# Patient Record
Sex: Male | Born: 1970 | Race: Black or African American | Hispanic: No | Marital: Single | Smoking: Current every day smoker
Health system: Southern US, Community
[De-identification: ages and names within clinical notes are randomized; demographics above are authoritative.]

## PROBLEM LIST (undated history)

## (undated) HISTORY — PX: HAND SURGERY: SHX662

---

## 2001-03-26 ENCOUNTER — Emergency Department (HOSPITAL_COMMUNITY): Admission: EM | Admit: 2001-03-26 | Discharge: 2001-03-26 | Payer: Self-pay | Admitting: Emergency Medicine

## 2001-03-26 ENCOUNTER — Encounter: Payer: Self-pay | Admitting: Emergency Medicine

## 2002-04-18 ENCOUNTER — Encounter: Payer: Self-pay | Admitting: Emergency Medicine

## 2002-04-18 ENCOUNTER — Emergency Department (HOSPITAL_COMMUNITY): Admission: EM | Admit: 2002-04-18 | Discharge: 2002-04-18 | Payer: Self-pay | Admitting: Emergency Medicine

## 2012-08-06 ENCOUNTER — Emergency Department (HOSPITAL_COMMUNITY): Payer: BC Managed Care – PPO

## 2012-08-06 ENCOUNTER — Encounter (HOSPITAL_COMMUNITY): Payer: Self-pay | Admitting: Emergency Medicine

## 2012-08-06 ENCOUNTER — Emergency Department (HOSPITAL_COMMUNITY)
Admission: EM | Admit: 2012-08-06 | Discharge: 2012-08-06 | Disposition: A | Discharge status: Home / Self Care | Payer: BC Managed Care – PPO | Attending: Emergency Medicine | Admitting: Emergency Medicine

## 2012-08-06 DIAGNOSIS — R0602 Shortness of breath: Secondary | ICD-10-CM | POA: Insufficient documentation

## 2012-08-06 DIAGNOSIS — F17200 Nicotine dependence, unspecified, uncomplicated: Secondary | ICD-10-CM | POA: Insufficient documentation

## 2012-08-06 DIAGNOSIS — J90 Pleural effusion, not elsewhere classified: Secondary | ICD-10-CM | POA: Insufficient documentation

## 2012-08-06 LAB — CBC WITH DIFFERENTIAL/PLATELET
Basophils Absolute: 0.1 10*3/uL (ref 0.0–0.1)
Basophils Relative: 1 % (ref 0–1)
Eosinophils Absolute: 0.3 10*3/uL (ref 0.0–0.7)
Lymphs Abs: 4 10*3/uL (ref 0.7–4.0)
MCH: 28.4 pg (ref 26.0–34.0)
Neutrophils Relative %: 57 % (ref 43–77)
Platelets: 211 10*3/uL (ref 150–400)
RBC: 5.35 MIL/uL (ref 4.22–5.81)
RDW: 13 % (ref 11.5–15.5)

## 2012-08-06 LAB — POCT I-STAT TROPONIN I: Troponin i, poc: 0.01 ng/mL (ref 0.00–0.08)

## 2012-08-06 LAB — BASIC METABOLIC PANEL
GFR calc Af Amer: >90 mL/min (ref >90)
GFR calc non Af Amer: 86 mL/min — ABNORMAL LOW (ref >90)
Glucose, Bld: 99 mg/dL (ref 70–99)
Potassium: 3.9 mEq/L (ref 3.5–5.1)
Sodium: 138 mEq/L (ref 135–145)

## 2012-08-06 LAB — D-DIMER, QUANTITATIVE: D-Dimer, Quant: 0.56 ug/mL-FEU — ABNORMAL HIGH (ref 0.00–0.48)

## 2012-08-06 MED ORDER — PROMETHAZINE HCL 25 MG PO TABS: 25.0000 mg | ORAL_TABLET | Freq: Four times a day (QID) | ORAL | Status: AC | PRN

## 2012-08-06 MED ORDER — IOHEXOL 350 MG/ML SOLN
100.0000 mL | Freq: Once | INTRAVENOUS | Status: AC | PRN
  Administered 2012-08-06: 100 mL via INTRAVENOUS

## 2012-08-06 MED ORDER — ONDANSETRON HCL 4 MG/2ML IJ SOLN
4.0000 mg | Freq: Once | INTRAMUSCULAR | Status: DC
  Filled 2012-08-06: qty 2

## 2012-08-06 MED ORDER — OXYCODONE-ACETAMINOPHEN 5-325 MG PO TABS: 2.0000 | ORAL_TABLET | Freq: Four times a day (QID) | ORAL | Status: AC | PRN

## 2012-08-06 MED ORDER — MORPHINE SULFATE 4 MG/ML IJ SOLN
4.0000 mg | Freq: Once | INTRAMUSCULAR | Status: DC
  Filled 2012-08-06: qty 1

## 2012-08-06 NOTE — ED Notes (Signed)
Pt states pain still present today, worse yesterday, states had this same type of pain several years ago and resolved on own, denies shortness of breath, nausea, diaphoresis, radiating pain

## 2012-08-06 NOTE — ED Notes (Signed)
Pt from home and reports pain under R ribs. Pt states that he has had this since lasst Sunday. Pt reports that it went away, but came back worse last night. Pt denies SOB, dizziness. Pt adds that it is uncomfortable to lay down. 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 Pt is A&O in NAD. Pt denies N/V, SOB and or any other CP. Pt added that he has had this pain years ago with no tx.

## 2012-08-06 NOTE — ED Provider Notes (Signed)
History     CSN: [id number]  Arrival date & time 08/06/12  1217   First MD Initiated Contact with Patient 08/06/12 1311      Chief Complaint  Patient presents with  . Chest Pain    rib    (Consider location/radiation/quality/duration/timing/severity/associated sxs/prior treatment) HPI Comments: Patient is a 42 year old male who presents today with shortness of breath in chest pain since Sunday. Sunday the pain began after he ate dinner and he thought it was gas pains. He went to bed and the pain resolved Monday. The pain came back yesterday. The pain is a dull ache associated with breathing difficulty. He states it is not a sharp pain when he breathes in, it is just very uncomfortable to breathe. He has had pain like this before which resolved spontaneously when he was in his late teens. He sees a primary care physician regularly and states he has no medical problems. 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 No recent surgeries. He denies any leg swelling or prior history of DVT or pulmonary embolism.  The history is provided by the patient. No language interpreter was used.    History reviewed. No pertinent past medical history.  Past Surgical History  Procedure Laterality Date  . Hand surgery Right     No family history on file.  History  Substance Use Topics  . Smoking status: Current Every Day Smoker -- 0.50 packs/day    Types: Cigarettes  . Smokeless tobacco: Not on file  . Alcohol Use: Yes     Comment: socially      Review of Systems  Constitutional: Negative for fever and chills.  Respiratory: Positive for chest tightness and shortness of breath. Negative for cough.   Cardiovascular: Positive for chest pain. Negative for leg swelling.  Gastrointestinal: Negative for nausea, vomiting and abdominal pain.  All other systems reviewed and are negative.    Allergies  Review of patient's allergies indicates not on file.  Home Medications  No current  outpatient prescriptions on file.  BP 133/83  Pulse 103  Temp(Src) 98.7 F (37.1 C)  SpO2 98%  Physical Exam  Nursing note and vitals reviewed. Constitutional: He is oriented to person, place, and time. He appears well-developed and well-nourished. No distress.  HENT:  Head: Normocephalic and atraumatic.  Right Ear: External ear normal.  Left Ear: External ear normal.  Nose: Nose normal.  Eyes: Conjunctivae are normal.  Neck: Normal range of motion. No tracheal deviation present.  Cardiovascular: Normal rate, regular rhythm, normal heart sounds, intact distal pulses and normal pulses.   Pulmonary/Chest: Effort normal and breath sounds normal. No stridor.  Mildly tender to palpation over right chest; not consistently reproducible - sometimes palpation improves pain  Abdominal: Soft. He exhibits no distension. There is no tenderness.  Musculoskeletal: Normal range of motion.  Neurological: He is alert and oriented to person, place, and time.  Skin: Skin is warm and dry. He is not diaphoretic.  Psychiatric: He has a normal mood and affect. His behavior is normal.    ED Course  Procedures (including critical care time)  Labs Reviewed  CBC WITH DIFFERENTIAL - Abnormal; Notable for the following:    WBC 11.9 (*)    All other components within normal limits  BASIC METABOLIC PANEL - Abnormal; Notable for the following:    GFR calc non Af Amer 86 (*)    All other components within normal limits  D-DIMER, QUANTITATIVE - Abnormal; Notable for the  following:    D-Dimer, Quant 0.56 (*)    All other components within normal limits  POCT I-STAT TROPONIN I   Dg Chest 2 View  08/06/2012   *RADIOLOGY REPORT*  Clinical Data: Chest pain  CHEST - 2 VIEW  Comparison: None.  Findings: Heart size is upper normal.  Negative for heart failure. The lungs are free of infiltrate effusion or mass.  No skeletal abnormality.  IMPRESSION: Negative   Original Report Authenticated By: Janeece Riggers, M.D.    Ct Angio Chest Pe W/cm &/or Wo Cm  08/06/2012   *RADIOLOGY REPORT*  Clinical Data: Chest pain.  Elevated D-dimer.  CT ANGIOGRAPHY CHEST  Technique:  Multidetector CT imaging of the chest using the standard protocol during bolus administration of intravenous contrast. Multiplanar reconstructed images including MIPs were obtained and reviewed to evaluate the vascular anatomy.  Contrast: OMNIPAQUE IOHEXOL 350 MG/ML SOLN  Comparison: Chest x-ray dated 08/06/2012  Findings: There are no pulmonary emboli.  There is a small right pleural effusion.  No infiltrates.  There are scattered lymph nodes in the mediastinum, none of which are pathologically enlarged. Overall heart size and pulmonary vascularity are normal.  The osseous structures are normal.  Visualized portion of the upper abdomen is normal.  IMPRESSION: Small nonspecific right pleural effusion.  Otherwise, normal exam. No pulmonary emboli.   Original Report Authenticated By: Francene Boyers, M.D.     1. Pleural effusion       MDM  Patient presents with right sided chest pain associated with shortness of breath. Troponin negative. No concern for ACS. CT angio was done to rule out PE and pleural effusion was found. It is small and likely self limiting. Discussed this with patient. Pulmonology referral if symptoms do not improve. Return instructions given. Case discussed with Dr. Clarene Duke who agrees with plan. Vital signs stable for discharge. Patient / Family / Caregiver informed of clinical course, understand medical decision-making process, and agree with plan.         Mora Bellman, PA-C 08/07/12 1238

## 2012-08-06 NOTE — ED Notes (Signed)
Bed:WA03<BR> Expected date:<BR> Expected time:<BR> Means of arrival:<BR> Comments:<BR>

## 2012-08-06 NOTE — ED Notes (Signed)
Pt ambulates in hallway with O2 sats 100% and HR 91. Pt denies shortness of breath or increased chest pain.

## 2012-08-06 NOTE — Progress Notes (Signed)
   CARE MANAGEMENT ED NOTE 08/06/2012  Patient:  AURTHUR, WINGERTER   Account Number:  [id number]  Date Initiated:  08/06/2012  Documentation initiated by:  Radford Pax  Subjective/Objective Assessment:   patient presents to ED with pain under his ribs.     Subjective/Objective Assessment Detail:     Action/Plan:   Action/Plan Detail:   Anticipated DC Date:       Status Recommendation to Physician:   Result of Recommendation:    Other ED Services  Consult Working Plan    DC Planning Services  Other  PCP issues    Choice offered to / List presented to:            Status of service:  Completed, signed off  ED Comments:   ED Comments Detail:  Patient listed as not having a pcp.  EDCM spke to patient who stated Dr. Ralene Ok in [place] is his pcp. Offfered support to patient.  No furthe rneeds at this time.  System updated.

## 2012-08-06 NOTE — ED Notes (Signed)
offered patient morphine pain med, pt states he doesn't want at this time, family in room

## 2012-08-09 NOTE — ED Provider Notes (Signed)
Medical screening examination/treatment/procedure(s) were performed by non-physician practitioner and as supervising physician I was immediately available for consultation/collaboration.   Laray Anger, DO 08/09/12 1414

## 2014-12-31 IMAGING — CT CT ANGIO CHEST
1 of 2 series · 20 of 32 positions shown · IV contrast (OMNIPAQUE 300)
Comparison: Chest x-ray dated 08/06/2012

CLINICAL DATA: Chest pain.  Elevated D-dimer.

CT ANGIOGRAPHY CHEST
TECHNIQUE: Multidetector CT imaging of the chest using the
standard protocol during bolus administration of intravenous
contrast. Multiplanar reconstructed images including MIPs were
obtained and reviewed to evaluate the vascular anatomy.
Contrast: 100mL OMNIPAQUE IOHEXOL 350 MG/ML SOLN

[Series 8: thins for pacs · axial · 0.74mm/px · z∈[-200,+43]mm · 20 of 271 slices shown]
[im 14/271  lung]
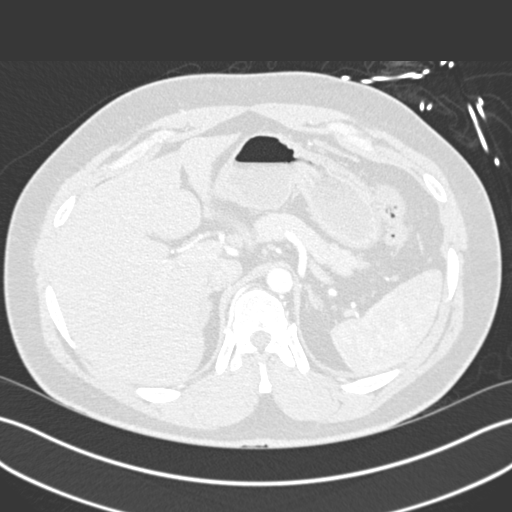
[im 28/271  mediastinal]
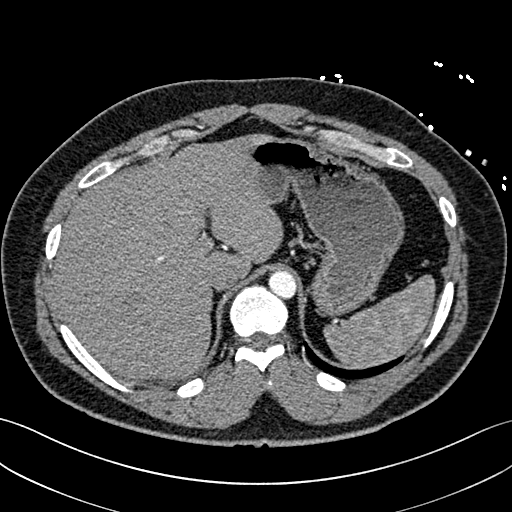
[im 41/271  lung]
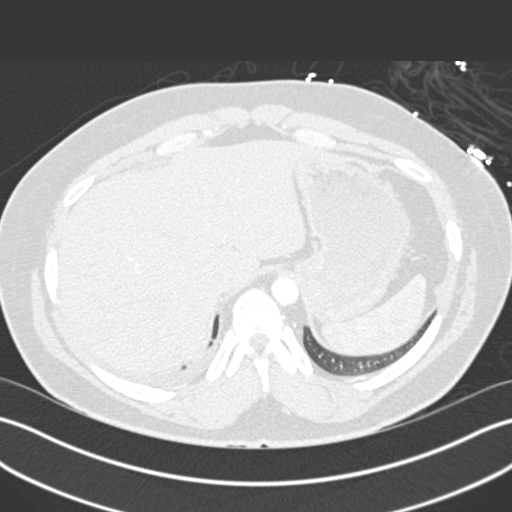
[im 55/271  mediastinal]
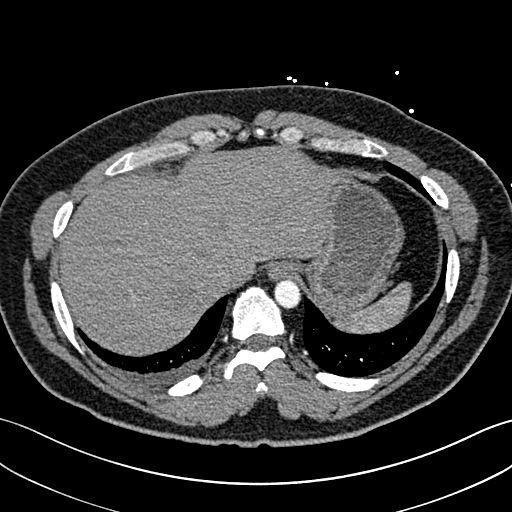
[im 68/271  lung]
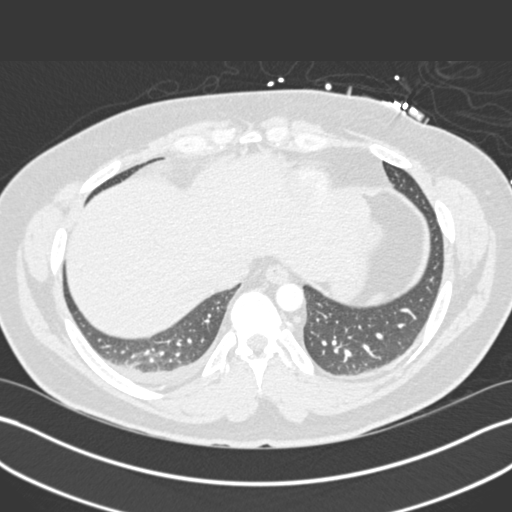
[im 91/271  mediastinal]
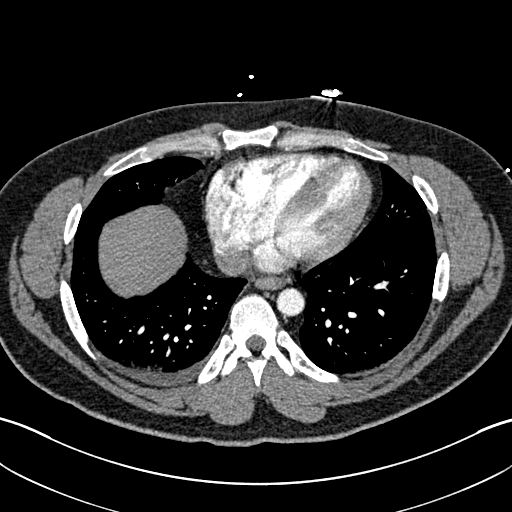
[im 95/271  lung]
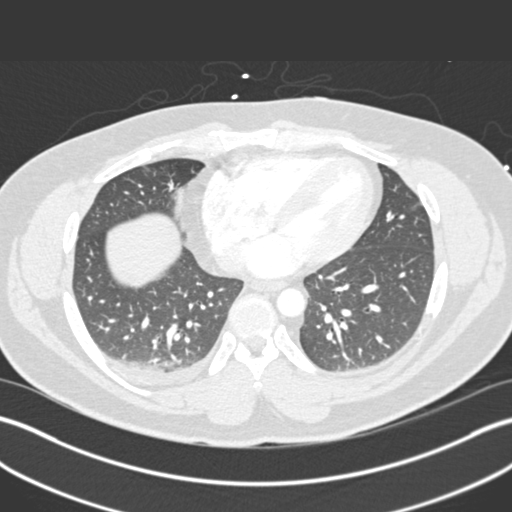
[im 109/271  mediastinal]
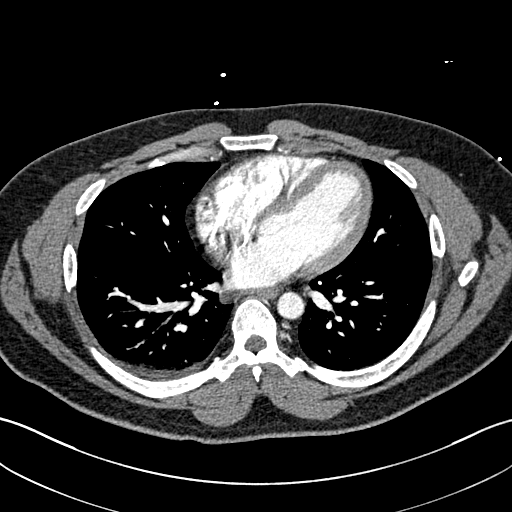
[im 122/271  lung]
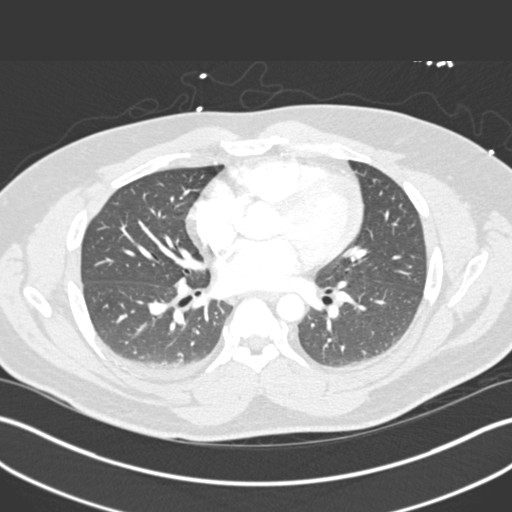
[im 126/271  mediastinal]
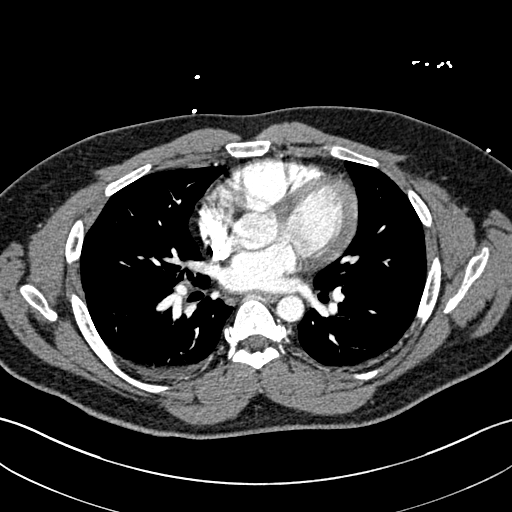
[im 136/271  lung]
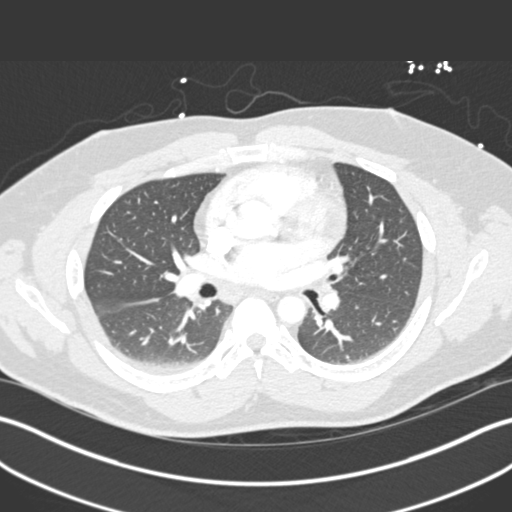
[im 149/271  mediastinal]
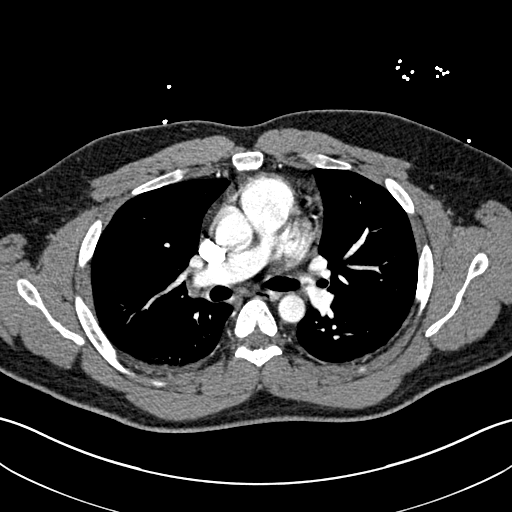
[im 163/271  lung]
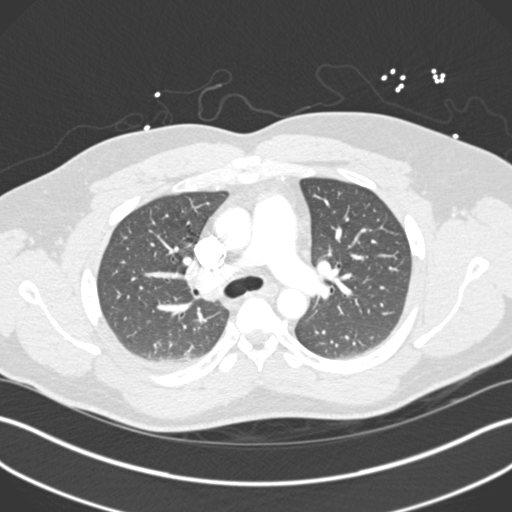
[im 176/271  mediastinal]
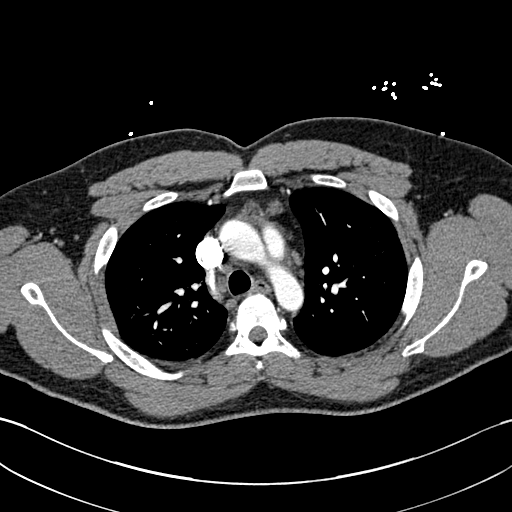
[im 181/271  lung]
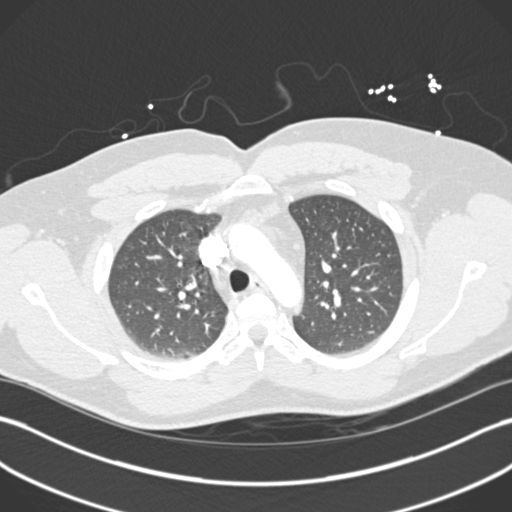
[im 203/271  mediastinal]
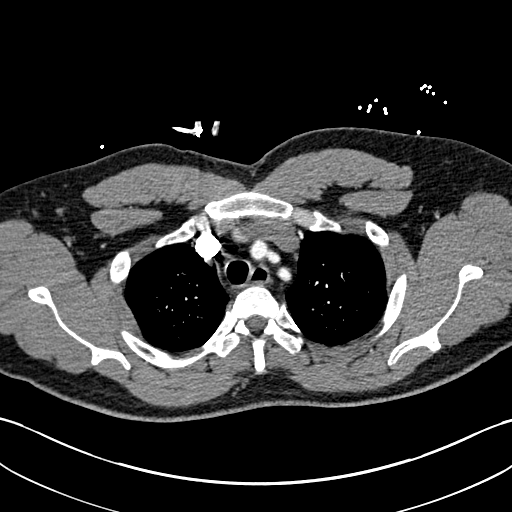
[im 217/271  lung]
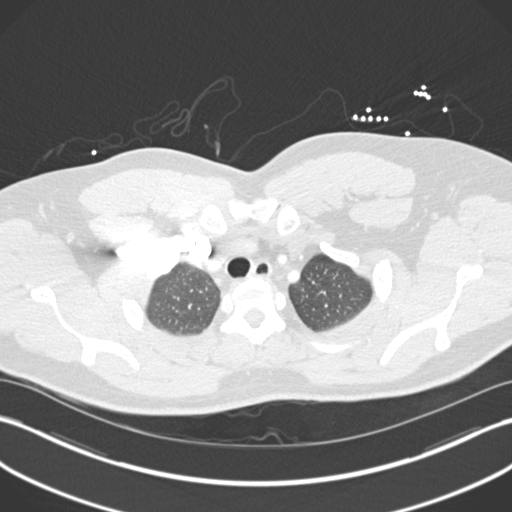
[im 230/271  mediastinal]
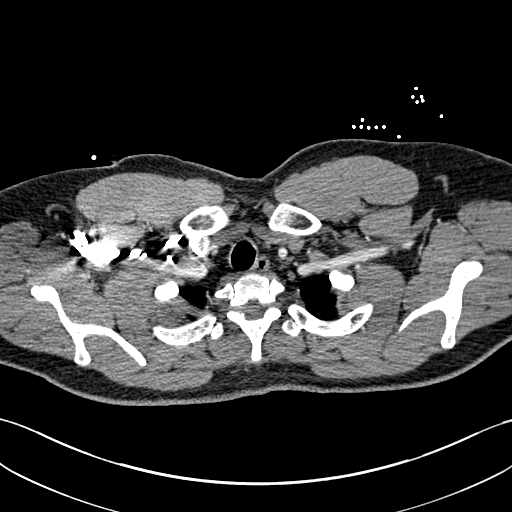
[im 244/271  lung]
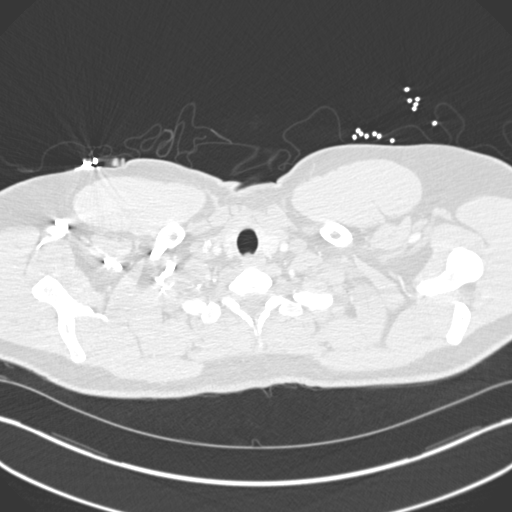
[im 257/271  mediastinal]
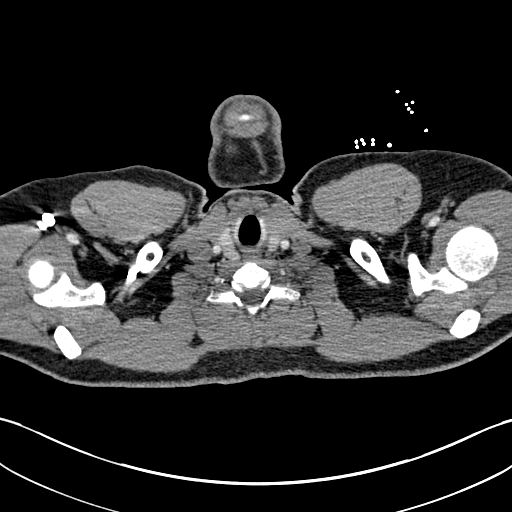

[20 of 32 positions shown; findings below may reference images not displayed]

FINDINGS: There are no pulmonary emboli.  There is a small right
pleural effusion.  No infiltrates.  There are scattered lymph nodes
in the mediastinum, none of which are pathologically enlarged.
Overall heart size and pulmonary vascularity are normal.  The
osseous structures are normal.  Visualized portion of the upper
abdomen is normal.
IMPRESSION: Small nonspecific right pleural effusion.  Otherwise, normal exam.
No pulmonary emboli.

## 2017-05-14 ENCOUNTER — Other Ambulatory Visit: Payer: Self-pay

## 2017-05-14 ENCOUNTER — Encounter: Payer: Self-pay | Admitting: Physical Therapy

## 2017-05-14 ENCOUNTER — Ambulatory Visit: Payer: Commercial Managed Care - PPO | Attending: Internal Medicine | Admitting: Physical Therapy

## 2017-05-14 DIAGNOSIS — R293 Abnormal posture: Secondary | ICD-10-CM | POA: Insufficient documentation

## 2017-05-14 DIAGNOSIS — M542 Cervicalgia: Secondary | ICD-10-CM | POA: Insufficient documentation

## 2017-05-14 DIAGNOSIS — M6281 Muscle weakness (generalized): Secondary | ICD-10-CM | POA: Insufficient documentation

## 2017-05-14 NOTE — Patient Instructions (Signed)
   Extensors, Supine   Lie supine, head on small, rolled towel. Gently tuck chin and bring toward chest. Hold _5__ seconds. Repeat __10_ times per session. Do _4-5__ sessions per day.  Copyright  VHI. All rights reserved.  Flexibility: Neck Retraction   Pull head straight back, keeping eyes and jaw level. Repeat _10___ times per set. Do __1__ sets per session. Do __4-5__ sessions per day.  http://orth.exer.us/344   Copyright  VHI. All rights reserved.   http://gt2.exer.us/30   Copyright  VHI. All rights reserved.  Side-Bending   One hand on opposite side of head, pull head to side as far as is comfortable. Stop if there is pain. Hold _20___ seconds. Repeat with other hand to other side. Repeat __3__ times. Do ___4-5_ sessions per day.   Copyright  VHI. All rights reserved.  Scapular Retraction (Standing)   With arms at sides, pinch shoulder blades together. Repeat __10__ times per set. Do _1___ sets per session. Do __4-5__ sessions per day.  http://orth.exer.us/944   Copyright  VHI. All rights reserved.      Keith SharpsStacy Chandler Orozco PT [facility] [address], [address] [place], [place] [address] Phone # [phone] Fax [phone]

## 2017-05-14 NOTE — Therapy (Signed)
[facility] [address], [address] [place], [place], [address] Phone: [phone]   Fax:  [phone]  Physical Therapy Evaluation  Patient Details  Name: Keith Orozco MRN: [id number] Date of Birth: 1970-06-23 Referring Provider: Dr. Ludwig ClarksMoreira   Encounter Date: 05/14/2017  PT End of Session - 05/14/17 1711    Visit Number  1    Date for PT Re-Evaluation  07/09/17    Authorization Type  [facility]    PT Start Time  1530    PT Stop Time  1612    PT Time Calculation (min)  42 min    Activity Tolerance  Patient tolerated treatment well       History reviewed. No pertinent past medical history.  Past Surgical History:  Procedure Laterality Date  . HAND SURGERY Right     There were no vitals filed for this visit.   Subjective Assessment - 05/14/17 1534    Subjective  1 month history of right neck pain he attributes to a lot of driving.  Drives a city bus aggravates.  Driving my car is OK.      Pertinent History  been driving bus for 27 years    Diagnostic tests  none    Patient Stated Goals  get this pain squared away    Currently in Pain?  Yes    Pain Score  4     Pain Location  Neck    Pain Orientation  Right    Aggravating Factors   turning my head;  reaching while driving for the fare box;  reaching for button to lower bus height    Pain Relieving Factors  Alleve;  resting right hand on thigh         [facility] PT Assessment - 05/14/17 0001      Assessment   Medical Diagnosis  cervicalgia and shoulder pain    Referring Provider  Dr. Ludwig ClarksMoreira    Onset Date/Surgical Date  -- 1 month    Hand Dominance  Right    Next MD Visit  April    Prior Therapy  long time ago when playing ball      Precautions   Precautions  None      Restrictions   Weight Bearing Restrictions  No      Balance Screen   Has the patient fallen in the past 6 months  No    Has the patient had a decrease in activity level because of a fear of falling?    No    Is the patient reluctant to leave their home because of a fear of falling?   No      Home Public house managernvironment   Living Environment  Private residence      Prior Function   Level of Independence  Independent    Vocation  Full time employment    Vocation Requirements  drive city bus    Leisure  watch sports "I'm a home body"      Observation/Other Assessments   Focus on Therapeutic Outcomes (FOTO)   41% limitation       Posture/Postural Control   Postural Limitations  Rounded Shoulders;Forward head;Decreased lumbar lordosis      AROM   Right/Left Shoulder  -- full shoulder ROM and painfree    Cervical Flexion  50    Cervical Extension  70    Cervical - Right Side Bend  28    Cervical - Left Side Bend  30  Cervical - Right Rotation  40    Cervical - Left Rotation  43      Strength   Right Shoulder Flexion  5/5    Right Shoulder Extension  5/5    Right Shoulder ABduction  5/5    Right Shoulder Horizontal ABduction  4/5    Left Shoulder Flexion  5/5    Left Shoulder Extension  5/5    Left Shoulder ABduction  5/5    Left Shoulder Horizontal ABduction  4/5    Cervical Flexion  4/5    Cervical Extension  4/5      Palpation   Palpation comment  tender right upper trap, levator and cervical paraspinals      Distraction Test   Findngs  Negative              No data recorded  Objective measurements completed on examination: See above findings.              PT Education - 05/14/17 1710    Education provided  Yes    Education Details  cervical retractions, upper trap stretch, sitting postural correction    Person(s) Educated  Patient    Methods  Explanation;Handout    Comprehension  Verbalized understanding;Returned demonstration       PT Short Term Goals - 05/14/17 1719      PT SHORT TERM GOAL #1   Title  The patient will demonstrate understanding of  initial self care strategies including postural correction, stretching, heat    Time  4     Period  Weeks    Status  New    Target Date  06/11/17      PT SHORT TERM GOAL #2   Title  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    Time  4    Period  Weeks    Status  New      PT SHORT TERM GOAL #3   Title  Improved cervical sidebending ROM to 35 and rotation to 45 degrees bilaterally    Time  4    Period  Weeks    Status  New        PT Long Term Goals - 05/14/17 1721      PT LONG TERM GOAL #1   Title  The patient will be independent in safe self progression of HEP     Time  8    Period  Weeks    Status  New    Target Date  07/09/17      PT LONG TERM GOAL #2   Title  The patient will report a 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    Time  8    Period  Weeks    Status  New      PT LONG TERM GOAL #3   Title  The patient will have improved bilateral cervical rotation to 50 degrees needed for driving    Time  8    Period  Weeks    Status  New      PT LONG TERM GOAL #4   Title  Scapular/cervical strength 4+/5 needed for maintaining postural support for operating the city bus    Time  8    Period  Weeks    Status  New      PT LONG TERM GOAL #5   Title  FOTO functional outcome score  improved from 41% limitation to 25% indicating improved function with less pain    Time  8    Period  Weeks    Status  New             Plan - 05/14/17 1711    Clinical Impression Statement  The patient reports a 1 month history of pain in right neck/upper trapezius muscle region while driving the city bus.  He reports the pain is especially aggravated with reaching the fare box and the buttons to operate the bus functions.  He has numerous tender points in his cervical paraspinals, upper trap and levator scapulae muscles.  Decreased cervical ROM especially sidebending and rotation.  UE ROM is full and painless.   Good strength overall except weakness in middle and lower traps and deep cervical flexors  and extensors.   Poor sitting posture.  No neural signs.  He would benefit from PT to address these deficits.      History and Personal Factors relevant to plan of care:  no co-morbidities;  lack of regular exercise    Clinical Presentation  Stable    Clinical Decision Making  Low    Rehab Potential  Good    Clinical Impairments Affecting Rehab Potential  none    PT Frequency  2x / week    PT Duration  8 weeks    PT Treatment/Interventions  ADLs/Self Care Home Management;Electrical Stimulation;Cryotherapy;Ultrasound;Traction;Moist Heat;Iontophoresis 4mg /ml Dexamethasone;Therapeutic exercise;Therapeutic activities;Patient/family education;Neuromuscular re-education;Manual techniques;Taping;Dry needling    PT Next Visit Plan  DN right cervical musculature;  supine or standing band ex;  review sitting posture with lumbar roll and initial HEP;  ES/heat as needed       Patient will benefit from skilled therapeutic intervention in order to improve the following deficits and impairments:  Pain, Increased fascial restricitons, Increased muscle spasms, Postural dysfunction, Decreased range of motion, Decreased strength  Visit Diagnosis: Cervicalgia - Plan: PT plan of care cert/re-cert  Muscle weakness (generalized) - Plan: PT plan of care cert/re-cert  Abnormal posture - Plan: PT plan of care cert/re-cert     Problem List There are no active problems to display for this patient.  Lavinia Sharps, PT 05/14/17 5:26 PM Phone: [phone] Fax: [phone]  Vivien Presto 05/14/2017, 5:26 PM  [facility] [address] [place], [place], [address] Phone: [phone]   Fax:  [phone]  Name: Keith Orozco MRN: [id number] Date of Birth: 06-26-1970

## 2017-05-21 ENCOUNTER — Encounter: Payer: Self-pay | Admitting: Physical Therapy

## 2017-05-21 ENCOUNTER — Ambulatory Visit: Payer: Commercial Managed Care - PPO | Attending: Internal Medicine | Admitting: Physical Therapy

## 2017-05-21 DIAGNOSIS — M542 Cervicalgia: Secondary | ICD-10-CM | POA: Diagnosis not present

## 2017-05-21 DIAGNOSIS — M6281 Muscle weakness (generalized): Secondary | ICD-10-CM | POA: Insufficient documentation

## 2017-05-21 DIAGNOSIS — R293 Abnormal posture: Secondary | ICD-10-CM | POA: Insufficient documentation

## 2017-05-21 NOTE — Therapy (Signed)
[facility] [address], [address] [place], [place], [address] Phone: [phone]   Fax:  [phone]  Physical Therapy Treatment  Patient Details  Name: Keith Orozco MRN: [id number] Date of Birth: 1970-12-02 Referring Provider: Dr. Ludwig ClarksMoreira   Encounter Date: 05/21/2017  PT End of Session - 05/21/17 1509    Visit Number  2    Date for PT Re-Evaluation  07/09/17    Authorization Type  UMR    PT Start Time  1435    PT Stop Time  1514    PT Time Calculation (min)  39 min    Activity Tolerance  Patient tolerated treatment well    Behavior During Therapy  [facility]       History reviewed. No pertinent past medical history.  Past Surgical History:  Procedure Laterality Date  . HAND SURGERY Right     There were no vitals filed for this visit.  Subjective Assessment - 05/21/17 1434    Subjective  neck is about the same; doing exercises "when I had a chance."      Patient Stated Goals  get this pain squared away    Currently in Pain?  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, reaching for button to lower bus height    Pain Relieving Factors  aleve, resting Rt hand on thigh                       [facility] Adult PT Treatment/Exercise - 05/21/17 1437      Exercises   Exercises  Neck      Neck Exercises: Machines for Strengthening   UBE (Upper Arm Bike)  L2 x 6 min (3' fwd/3' bwd)      Neck Exercises: Theraband   Shoulder External Rotation  10 reps;Green    Horizontal ABduction  10 reps;Green      Neck Exercises: Supine   Capital Flexion  10 reps;5 secs      Manual Therapy   Manual Therapy  Soft tissue mobilization    Manual therapy comments  skilled monitoring and palpation of soft tissue during DN     Soft tissue mobilization  Rt UT/cervical paraspinals and suboccipital release      Neck Exercises: Stretches   Upper Trapezius Stretch  Right;Left;2 reps;30 seconds       Trigger Point Dry Needling - 05/21/17 1501    Consent Given?  Yes    Education Handout Provided  Yes    Muscles Treated Upper Body  Upper trapezius;Suboccipitals muscle group;Longissimus    Upper Trapezius Response  Twitch reponse elicited;Palpable increased muscle length    SubOccipitals Response  Twitch response elicited;Palpable increased muscle length    Longissimus Response  Twitch response elicited;Palpable increased muscle length cervical paraspinals           PT Education - 05/21/17 1509    Education provided  Yes    Education Details  HEP, DN    Person(s) Educated  Patient    Methods  Explanation;Demonstration;Handout    Comprehension  Verbalized understanding       PT Short Term Goals - 05/14/17 1719      PT SHORT  TERM GOAL #1   Title  The patient will demonstrate understanding of  initial self care strategies including postural correction, stretching, heat    Time  4    Period  Weeks    Status  New    Target Date  06/11/17      PT SHORT TERM GOAL #2   Title  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    Time  4    Period  Weeks    Status  New      PT SHORT TERM GOAL #3   Title  Improved cervical sidebending ROM to 35 and rotation to 45 degrees bilaterally    Time  4    Period  Weeks    Status  New        PT Long Term Goals - 05/14/17 1721      PT LONG TERM GOAL #1   Title  The patient will be independent in safe self progression of HEP     Time  8    Period  Weeks    Status  New    Target Date  07/09/17      PT LONG TERM GOAL #2   Title  The patient will report a 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    Time  8    Period  Weeks    Status  New      PT LONG TERM GOAL #3   Title  The patient will  have improved bilateral cervical rotation to 50 degrees needed for driving    Time  8    Period  Weeks    Status  New      PT LONG TERM GOAL #4   Title  Scapular/cervical strength 4+/5 needed for maintaining postural support for operating the city bus    Time  8    Period  Weeks    Status  New      PT LONG TERM GOAL #5   Title  FOTO functional outcome score improved from 41% limitation to 25% indicating improved function with less pain    Time  8    Period  Weeks    Status  New            Plan - 05/21/17 1514    Clinical Impression Statement  Pt reported decreased pain and tightness following DN with decreased trigger points noted today following DN and manual therapy.  Added 2 posture exercises today to HEP, but pt with poor postural awareness needing multiple cues throughout session for posture.  Will continue to benefit from PT to maximize function.    Rehab Potential  Good    Clinical Impairments Affecting Rehab Potential  none    PT Frequency  2x / week    PT Duration  8 weeks    PT Treatment/Interventions  ADLs/Self Care Home Management;Electrical Stimulation;Cryotherapy;Ultrasound;Traction;Moist Heat;Iontophoresis 4mg /ml Dexamethasone;Therapeutic exercise;Therapeutic activities;Patient/family education;Neuromuscular re-education;Manual techniques;Taping;Dry needling    PT Next Visit Plan  assess response to DN and continue DN/manual PRN, review HEP and add additional exercises;  review sitting posture with lumbar roll and initial HEP;  ES/heat as needed    PT Home Exercise Plan  Access Code: [email or web address]    Consulted and Agree with Plan of Care  Patient       Patient will benefit from skilled therapeutic intervention in order to improve the following deficits and  impairments:  Pain, Increased fascial restricitons, Increased muscle spasms, Postural dysfunction, Decreased range of motion, Decreased strength  Visit Diagnosis: Cervicalgia  Muscle weakness  (generalized)  Abnormal posture     Problem List There are no active problems to display for this patient.     Clarita Crane, PT, DPT 05/21/17 3:17 PM     [facility] [address] [place], [place], [address] Phone: [phone]   Fax:  [phone]  Name: Keith Orozco MRN: [id number] Date of Birth: 14-Mar-1970

## 2017-05-21 NOTE — Patient Instructions (Addendum)
Trigger Point Dry Needling  . What is Trigger Point Dry Needling (DN)? o DN is a physical therapy technique used to treat muscle pain and dysfunction. Specifically, DN helps deactivate muscle trigger points (muscle knots).  o A thin filiform needle is used to penetrate the skin and stimulate the underlying trigger point. The goal is for a local twitch response (LTR) to occur and for the trigger point to relax. No medication of any kind is injected during the procedure.   . What Does Trigger Point Dry Needling Feel Like?  o The procedure feels different for each individual patient. Some patients report that they do not actually feel the needle enter the skin and overall the process is not painful. Very mild bleeding may occur. However, many patients feel a deep cramping in the muscle in which the needle was inserted. This is the local twitch response.   Marland Kitchen. How Will I feel after the treatment? o Soreness is normal, and the onset of soreness may not occur for a few hours. Typically this soreness does not last longer than two days.  o Bruising is uncommon, however; ice can be used to decrease any possible bruising.  o In rare cases feeling tired or nauseous after the treatment is normal. In addition, your symptoms may get worse before they get better, this period will typically not last longer than 24 hours.   . What Can I do After My Treatment? o Increase your hydration by drinking more water for the next 24 hours. o You may place ice or heat on the areas treated that have become sore, however, do not use heat on inflamed or bruised areas. Heat often brings more relief post needling. o You can continue your regular activities, but vigorous activity is not recommended initially after the treatment for 24 hours. o DN is best combined with other physical therapy such as strengthening, stretching, and other therapies.   Access Code: [email or web address]  URL: [email or web address]  Date: 05/21/2017   Prepared by: Moshe CiproStephanie Janequa Kipnis   Exercises  Standing Shoulder Horizontal Abduction with Resistance - 10 reps - 1 sets - 1-2 sec hold - 1x daily - 7x weekly  Standing Shoulder External Rotation with Resistance - 10 reps - 1 sets - 1-2 sec hold - 1x daily - 7x weekly

## 2017-05-25 ENCOUNTER — Encounter: Payer: Self-pay | Admitting: Physical Therapy

## 2017-05-25 ENCOUNTER — Ambulatory Visit: Payer: Commercial Managed Care - PPO | Admitting: Physical Therapy

## 2017-05-25 DIAGNOSIS — M542 Cervicalgia: Secondary | ICD-10-CM | POA: Diagnosis not present

## 2017-05-25 DIAGNOSIS — M6281 Muscle weakness (generalized): Secondary | ICD-10-CM

## 2017-05-25 DIAGNOSIS — R293 Abnormal posture: Secondary | ICD-10-CM

## 2017-05-25 NOTE — Therapy (Addendum)
[facility] [address] [place], [place], [address] Phone: [phone]   Fax:  [phone]  Physical Therapy Treatment  Patient Details  Name: Keith Orozco MRN: [id number] Date of Birth: 1971-01-24 Referring Provider: Dr. Mellody Drown   Encounter Date: 05/25/2017  PT End of Session - 05/25/17 1524    Visit Number  3    Date for PT Re-Evaluation  07/09/17    Authorization Type  UMR    PT Start Time  7517    PT Stop Time  1525    PT Time Calculation (min)  40 min    Activity Tolerance  Patient tolerated treatment well    Behavior During Therapy  [facility] for tasks assessed/performed       History reviewed. No pertinent past medical history.  Past Surgical History:  Procedure Laterality Date  . HAND SURGERY Right     There were no vitals filed for this visit.  Subjective Assessment - 05/25/17 1447    Subjective  reports he did his exercises. DN was helpful and neck feels better.    Patient Stated Goals  get this pain squared away    Currently in Pain?  No/denies                       [facility] Adult PT Treatment/Exercise - 05/25/17 1448      Neck Exercises: Machines for Strengthening   UBE (Upper Arm Bike)  L5 x 6 min (3' fwd/3' bwd)      Neck Exercises: Theraband   Scapula Retraction  20 reps;Green standing; 5 sec hold    Shoulder Extension  20 reps;Green standing; 5 sec hold    Shoulder External Rotation  20 reps;Green supine on foam roll    Horizontal ABduction  Green;20 reps supine on foam roll    Other Theraband Exercises  supine overhead pull with green theraband x 20 reps on foam roll      Neck Exercises: Standing   Other Standing Exercises  standing ER with green theraband x20 bil      Neck Exercises: Supine   Capital Flexion  20 reps;5 secs on foam roll      Manual Therapy   Soft tissue mobilization  Rt UT/cervical paraspinals with marked improvement in trigger points      Neck  Exercises: Stretches   Other Neck Stretches  chest stretch on foam roll x 3 min               PT Short Term Goals - 05/14/17 1719      PT SHORT TERM GOAL #1   Title  The patient will demonstrate understanding of  initial self care strategies including postural correction, stretching, heat    Time  4    Period  Weeks    Status  New    Target Date  06/11/17      PT SHORT TERM GOAL #2   Title  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    Time  4    Period  Weeks    Status  New      PT SHORT TERM GOAL #3   Title  Improved cervical sidebending ROM to 35 and rotation to 45 degrees bilaterally    Time  4    Period  Weeks    Status  New        PT Long Term Goals -  05/14/17 1721      PT LONG TERM GOAL #1   Title  The patient will be independent in safe self progression of HEP     Time  8    Period  Weeks    Status  New    Target Date  07/09/17      PT LONG TERM GOAL #2   Title  The patient will report a 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    Time  8    Period  Weeks    Status  New      PT LONG TERM GOAL #3   Title  The patient will have improved bilateral cervical rotation to 50 degrees needed for driving    Time  8    Period  Weeks    Status  New      PT LONG TERM GOAL #4   Title  Scapular/cervical strength 4+/5 needed for 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  Will continue to benefit from PT to maximize function.    Rehab Potential  Good    Clinical Impairments Affecting Rehab Potential   none    PT Frequency  2x / week    PT Duration  8 weeks    PT Treatment/Interventions  ADLs/Self Care Home Management;Electrical Stimulation;Cryotherapy;Ultrasound;Traction;Moist Heat;Iontophoresis 48m/ml Dexamethasone;Therapeutic exercise;Therapeutic activities;Patient/family education;Neuromuscular re-education;Manual techniques;Taping;Dry needling    PT Next Visit Plan  assess response to DN and continue DN/manual PRN, review HEP and add additional exercises;  review sitting posture with lumbar roll and initial HEP;  ES/heat as needed    PT Home Exercise Plan  Access Code: [email or web address]    Consulted and Agree with Plan of Care  Patient       Patient will benefit from skilled therapeutic intervention in order to improve the following deficits and impairments:  Pain, Increased fascial restricitons, Increased muscle spasms, Postural dysfunction, Decreased range of motion, Decreased strength  Visit Diagnosis: Cervicalgia  Muscle weakness (generalized)  Abnormal posture     Problem List There are no active problems to display for this patient.     SLaureen Abrahams PT, DPT 05/25/17 3:31 PM  PHYSICAL THERAPY DISCHARGE SUMMARY  Visits from Start of Care: 3  Current functional level related to goals / functional outcomes: See above for current status.  Pt didn't return to PT.   Remaining deficits: See above   Education / Equipment: HEP  Plan: Patient agrees to discharge.  Patient goals were not met. Patient is being discharged due to not returning since the last visit.  ?????        KSigurd Sos PT 11/04/17 7:56 AM  [facility] [address] [place] [address]: [phone]  Fax:  [phone] Name: Keith PRESTAGEMRN: [id number] of Birth: 614-Jan-1972

## 2017-05-28 ENCOUNTER — Encounter: Payer: Commercial Managed Care - PPO | Admitting: Physical Therapy

## 2017-06-04 ENCOUNTER — Encounter: Payer: Commercial Managed Care - PPO | Admitting: Physical Therapy

## 2020-04-24 ENCOUNTER — Other Ambulatory Visit: Payer: Self-pay | Admitting: Internal Medicine

## 2020-04-24 DIAGNOSIS — R3129 Other microscopic hematuria: Secondary | ICD-10-CM

## 2020-05-23 ENCOUNTER — Ambulatory Visit
Admission: RE | Admit: 2020-05-23 | Discharge: 2020-05-23 | Disposition: A | Discharge status: Home / Self Care | Payer: Commercial Managed Care - PPO | Source: Ambulatory Visit | Attending: Internal Medicine | Admitting: Internal Medicine

## 2020-05-23 ENCOUNTER — Other Ambulatory Visit: Payer: Self-pay

## 2020-05-23 DIAGNOSIS — R3129 Other microscopic hematuria: Secondary | ICD-10-CM

## 2022-10-17 IMAGING — CT CT ABD-PELV W/O CM
1 of 2 series · 13 of 32 positions shown, 18 images · non-contrast
Comparison: None.

CLINICAL DATA: Microscopic hematuria.

EXAM:
CT ABDOMEN AND PELVIS WITHOUT CONTRAST
TECHNIQUE: Multidetector CT imaging of the abdomen and pelvis was performed
following the standard protocol without IV contrast.

[Series 2: abd/pelvis w/(date) · axial · 0.92mm/px · z∈[-474,-39]mm · 13 of 99 slices shown, 18 images]
[im 6/99  soft-tissue]
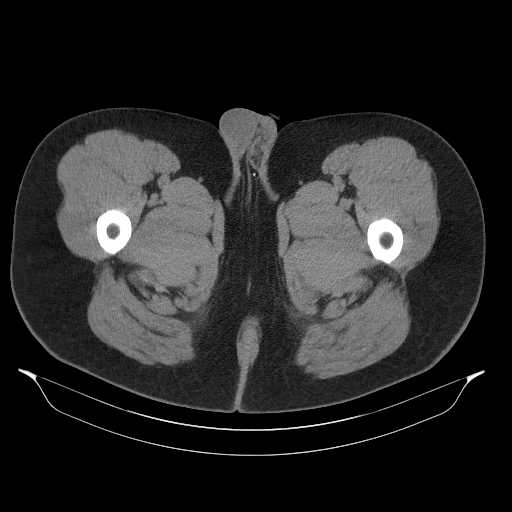
[im 6/99  bone]
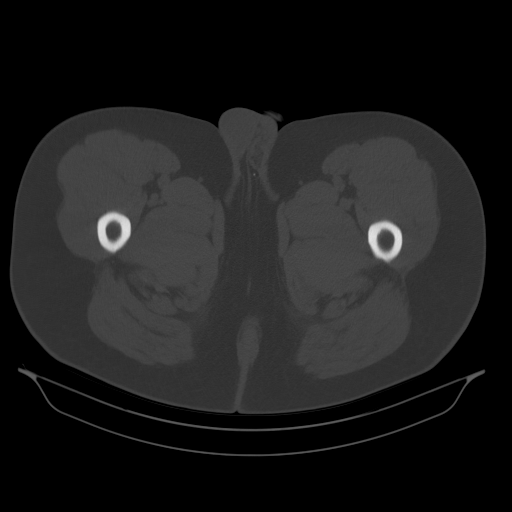
[im 16/99  soft-tissue]
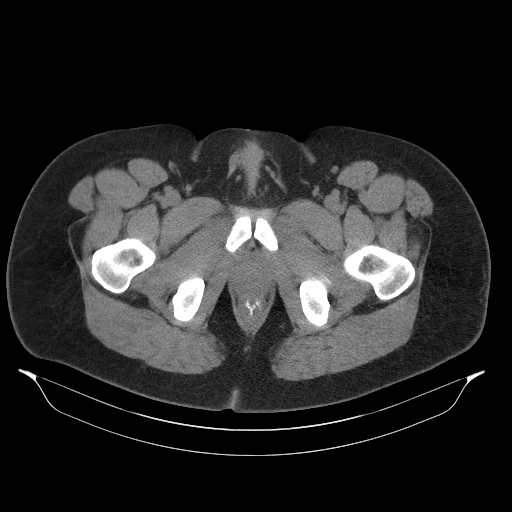
[im 21/99  soft-tissue]
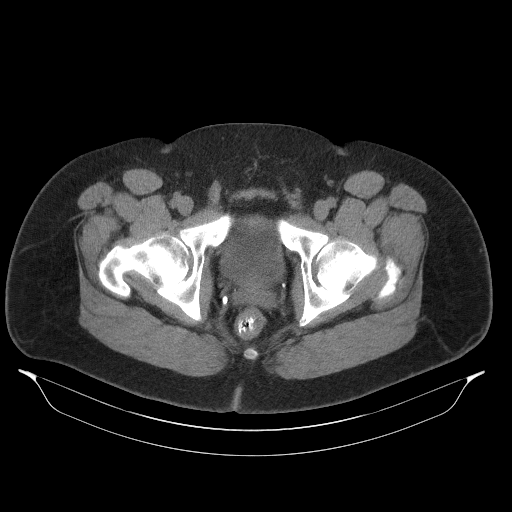
[im 31/99  soft-tissue]
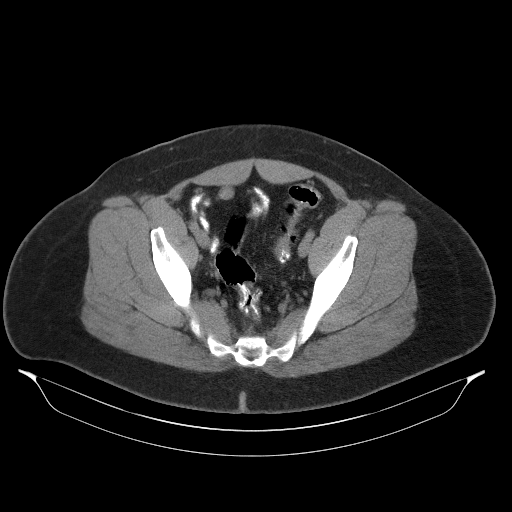
[im 37/99  soft-tissue]
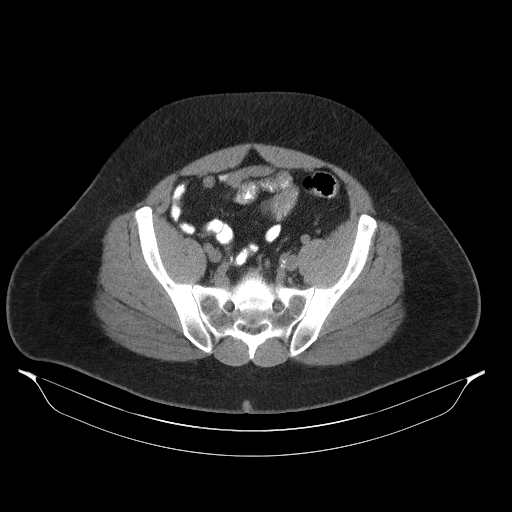
[im 47/99  soft-tissue]
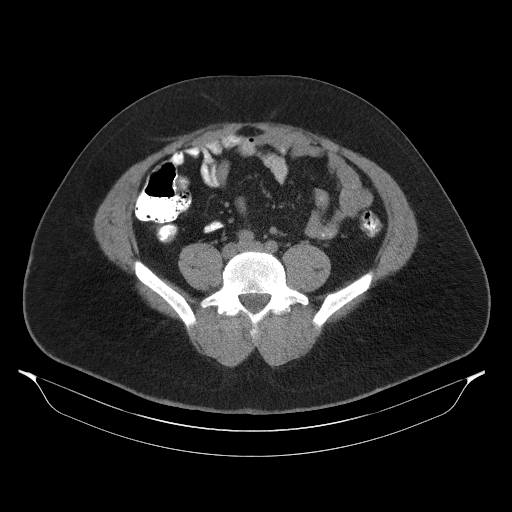
[im 52/99  soft-tissue]
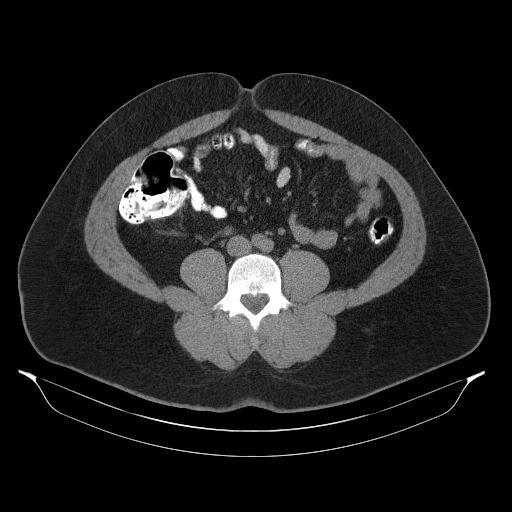
[im 62/99  soft-tissue]
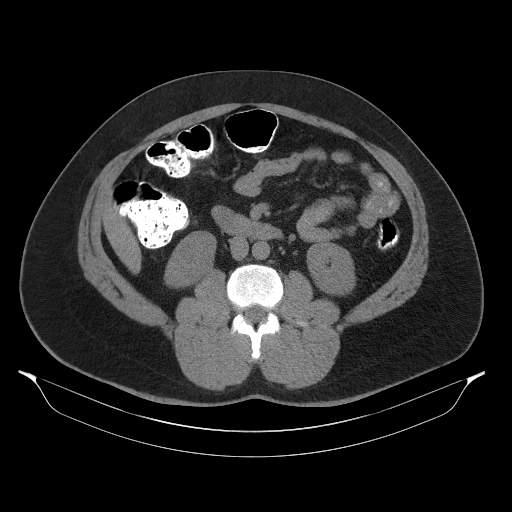
[im 68/99  soft-tissue]
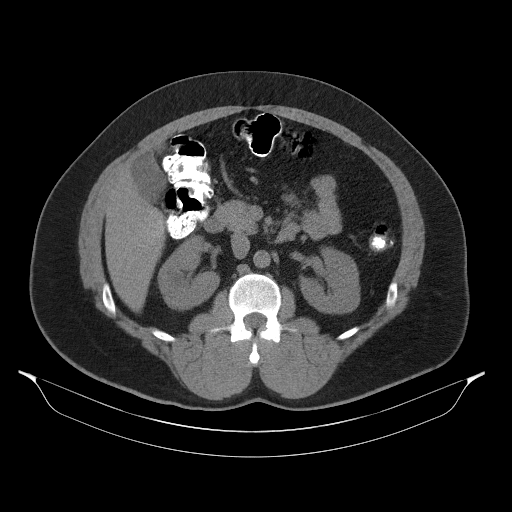
[im 68/99  bone]
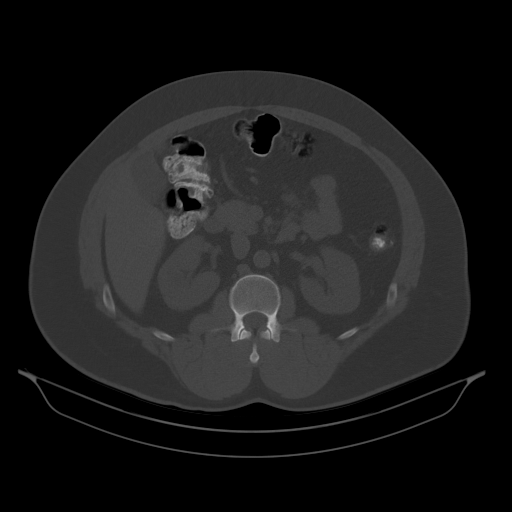
[im 78/99  soft-tissue]
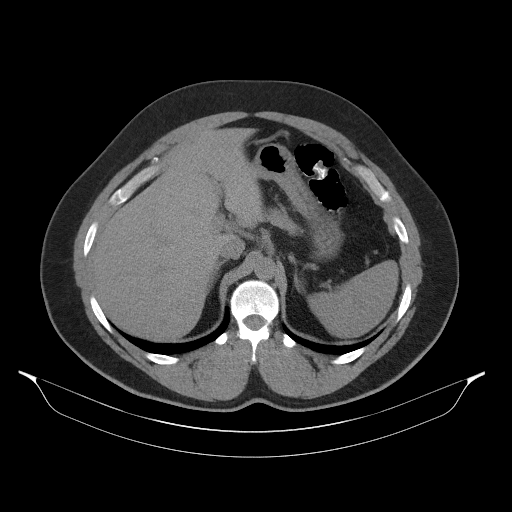
[im 78/99  lung]
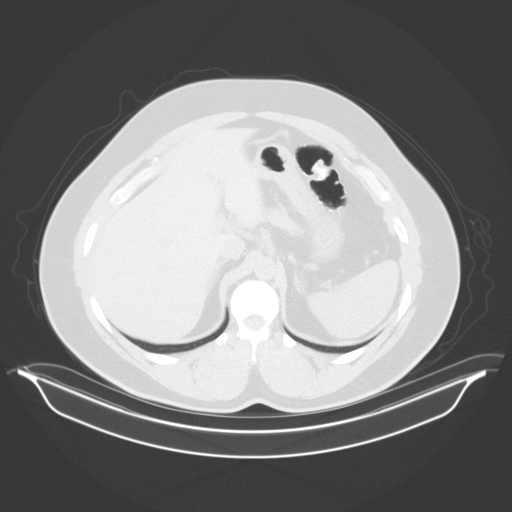
[im 83/99  soft-tissue]
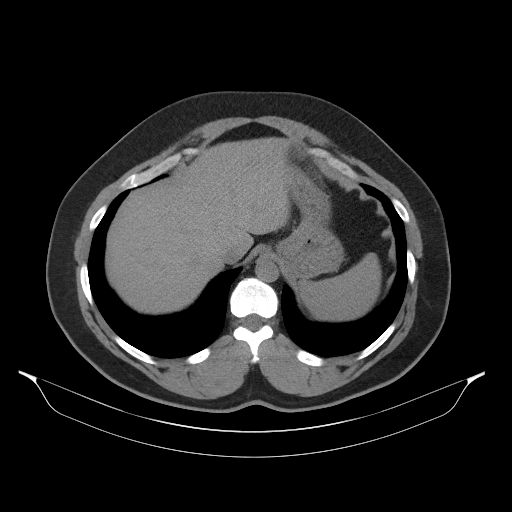
[im 83/99  lung]
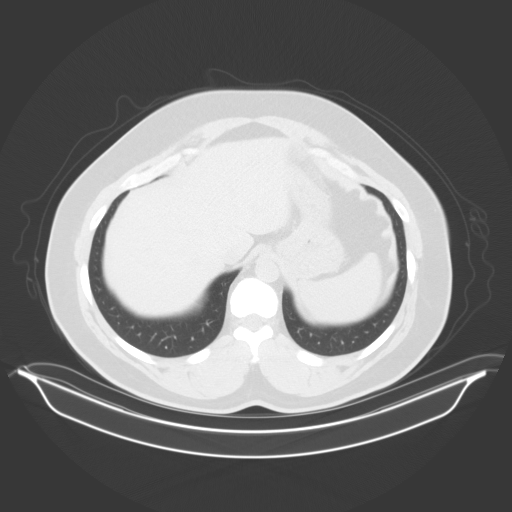
[im 88/99  lung]
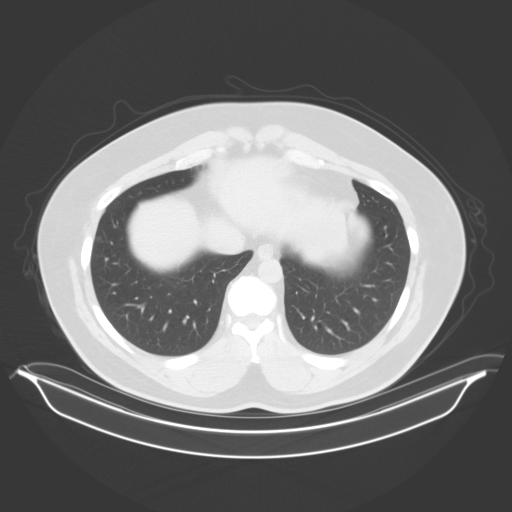
[im 93/99  soft-tissue]
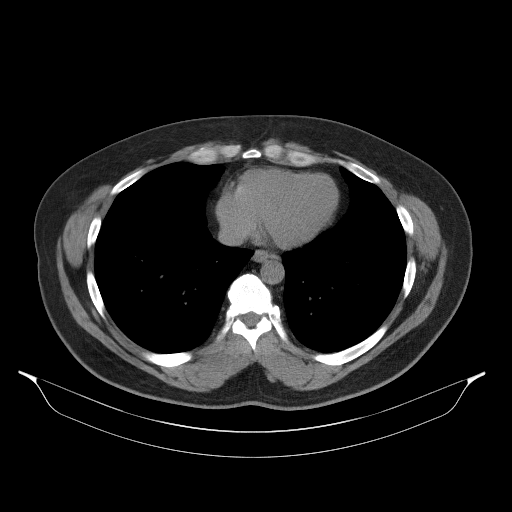
[im 93/99  lung]
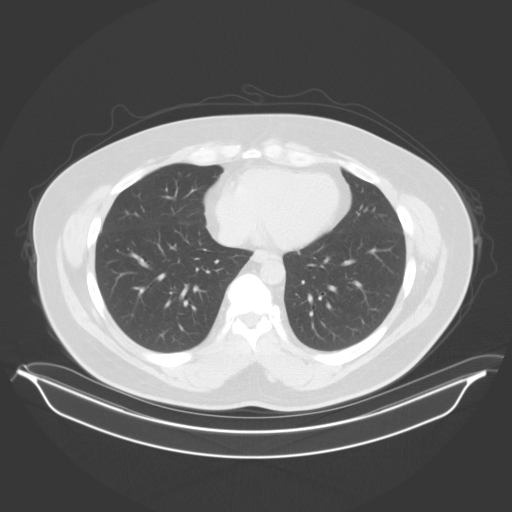

[13 of 32 positions shown; findings below may reference images not displayed]

FINDINGS: Lower chest: The lung bases are clear. No acute airspace disease or
pleural effusion.

Hepatobiliary: No focal hepatic abnormality on noncontrast exam.
Liver is normal in size and density. Gallbladder physiologically
distended, no calcified stone. No biliary dilatation.

Pancreas: No ductal dilatation or inflammation. No evidence of
pancreatic mass on this unenhanced exam.

Spleen: Normal in size without focal abnormality. Small splenule
anteriorly.

Adrenals/Urinary Tract: Normal adrenal glands. No hydronephrosis. No
significant perinephric edema. No renal calculi. Water density focus
in the central mid left kidney measuring approximately 12 mm likely
represents a simple cyst, but is incompletely characterized on this
noncontrast exam. No evidence of solid lesion. Both ureters are
decompressed without stones along the course. Urinary bladder is
minimally distended. There is no bladder wall thickening or stone.
No obvious bladder lesion.

Stomach/Bowel: Decompressed stomach. Normal positioning of the
duodenum and ligament of Treitz. Normal small bowel without
obstruction or inflammation. The appendix is not well visualized on
the current exam. No evidence of appendicitis. Small to moderate
colonic stool burden. No colonic wall thickening or inflammation.

Vascular/Lymphatic: Normal caliber abdominal aorta. Trace
atherosclerosis. No enlarged abdominal or pelvic lymph nodes.

Reproductive: Prostate is unremarkable.

Other: Tiny fat containing umbilical hernia. Minimal fat in the left
inguinal canal. No abdominal ascites. No focal fluid collection.

Musculoskeletal: There are no acute or suspicious osseous
abnormalities. Mild Schmorl's nodes in the lower thoracic spine.
IMPRESSION: 1. No renal stones or explanation for hematuria on this noncontrast
exam.
2. Probable simple cyst in the mid right kidney.
3. Small fat containing umbilical hernia.
4. Trace aortic atherosclerosis.

Aortic Atherosclerosis ([id number].P).

## 2023-05-01 ENCOUNTER — Ambulatory Visit
Admission: RE | Admit: 2023-05-01 | Discharge: 2023-05-01 | Disposition: A | Discharge status: Home / Self Care | Source: Ambulatory Visit | Attending: Internal Medicine | Admitting: Internal Medicine

## 2023-05-01 ENCOUNTER — Other Ambulatory Visit: Payer: Self-pay | Admitting: Internal Medicine

## 2023-05-01 DIAGNOSIS — R0789 Other chest pain: Secondary | ICD-10-CM
